# Patient Record
Sex: Female | Born: 1940 | Race: White | Hispanic: No | Marital: Married | Smoking: Former smoker
Health system: Southern US, Community
[De-identification: ages and names within clinical notes are randomized; demographics above are authoritative.]

## PROBLEM LIST (undated history)

## (undated) DIAGNOSIS — F32A Depression, unspecified: Secondary | ICD-10-CM

## (undated) DIAGNOSIS — M419 Scoliosis, unspecified: Secondary | ICD-10-CM

## (undated) DIAGNOSIS — G4730 Sleep apnea, unspecified: Secondary | ICD-10-CM

## (undated) DIAGNOSIS — E785 Hyperlipidemia, unspecified: Secondary | ICD-10-CM

## (undated) DIAGNOSIS — M810 Age-related osteoporosis without current pathological fracture: Secondary | ICD-10-CM

## (undated) DIAGNOSIS — B029 Zoster without complications: Secondary | ICD-10-CM

## (undated) DIAGNOSIS — F329 Major depressive disorder, single episode, unspecified: Secondary | ICD-10-CM

## (undated) HISTORY — DX: Hyperlipidemia, unspecified: E78.5

## (undated) HISTORY — PX: ABDOMINAL HERNIA REPAIR: SHX539

## (undated) HISTORY — DX: Sleep apnea, unspecified: G47.30

## (undated) HISTORY — DX: Depression, unspecified: F32.A

## (undated) HISTORY — PX: BUNIONECTOMY: SHX129

## (undated) HISTORY — DX: Major depressive disorder, single episode, unspecified: F32.9

## (undated) HISTORY — DX: Zoster without complications: B02.9

## (undated) HISTORY — PX: HAMMER TOE SURGERY: SHX385

## (undated) HISTORY — DX: Scoliosis, unspecified: M41.9

## (undated) HISTORY — DX: Age-related osteoporosis without current pathological fracture: M81.0

## (undated) HISTORY — PX: OTHER SURGICAL HISTORY: SHX169

---

## 1998-01-02 ENCOUNTER — Ambulatory Visit: Admission: RE | Admit: 1998-01-02 | Discharge: 1998-01-02 | Payer: Self-pay | Admitting: Internal Medicine

## 1998-05-04 ENCOUNTER — Ambulatory Visit: Admission: RE | Admit: 1998-05-04 | Discharge: 1998-05-04 | Payer: Self-pay | Admitting: Internal Medicine

## 1998-06-21 ENCOUNTER — Emergency Department (HOSPITAL_COMMUNITY): Admission: EM | Admit: 1998-06-21 | Discharge: 1998-06-21 | Payer: Self-pay | Admitting: Emergency Medicine

## 1998-10-03 ENCOUNTER — Emergency Department (HOSPITAL_COMMUNITY): Admission: EM | Admit: 1998-10-03 | Discharge: 1998-10-03 | Payer: Self-pay | Admitting: Emergency Medicine

## 1998-10-03 ENCOUNTER — Encounter: Payer: Self-pay | Admitting: Emergency Medicine

## 1998-11-15 ENCOUNTER — Encounter: Payer: Self-pay | Admitting: Obstetrics and Gynecology

## 1998-11-15 ENCOUNTER — Ambulatory Visit (HOSPITAL_COMMUNITY): Admission: RE | Admit: 1998-11-15 | Discharge: 1998-11-15 | Payer: Self-pay | Admitting: Obstetrics and Gynecology

## 1999-07-09 ENCOUNTER — Encounter: Admission: RE | Admit: 1999-07-09 | Discharge: 1999-07-09 | Payer: Self-pay | Admitting: General Surgery

## 1999-07-09 ENCOUNTER — Encounter: Payer: Self-pay | Admitting: General Surgery

## 1999-07-10 ENCOUNTER — Encounter (INDEPENDENT_AMBULATORY_CARE_PROVIDER_SITE_OTHER): Payer: Self-pay | Admitting: Specialist

## 1999-07-10 ENCOUNTER — Ambulatory Visit (HOSPITAL_BASED_OUTPATIENT_CLINIC_OR_DEPARTMENT_OTHER): Admission: RE | Admit: 1999-07-10 | Discharge: 1999-07-10 | Payer: Self-pay | Admitting: Anesthesiology

## 1999-12-15 ENCOUNTER — Encounter: Payer: Self-pay | Admitting: Internal Medicine

## 1999-12-15 ENCOUNTER — Ambulatory Visit (HOSPITAL_COMMUNITY): Admission: RE | Admit: 1999-12-15 | Discharge: 1999-12-15 | Payer: Self-pay | Admitting: Internal Medicine

## 1999-12-24 ENCOUNTER — Encounter: Admission: RE | Admit: 1999-12-24 | Discharge: 1999-12-24 | Payer: Self-pay | Admitting: Internal Medicine

## 1999-12-24 ENCOUNTER — Encounter: Payer: Self-pay | Admitting: Internal Medicine

## 2000-02-13 ENCOUNTER — Encounter: Payer: Self-pay | Admitting: Obstetrics and Gynecology

## 2000-02-13 ENCOUNTER — Encounter: Admission: RE | Admit: 2000-02-13 | Discharge: 2000-02-13 | Payer: Self-pay | Admitting: Obstetrics and Gynecology

## 2000-07-07 ENCOUNTER — Encounter: Admission: RE | Admit: 2000-07-07 | Discharge: 2000-07-07 | Payer: Self-pay | Admitting: Family Medicine

## 2000-08-06 ENCOUNTER — Encounter: Admission: RE | Admit: 2000-08-06 | Discharge: 2000-08-06 | Payer: Self-pay | Admitting: Sports Medicine

## 2000-08-16 ENCOUNTER — Encounter: Admission: RE | Admit: 2000-08-16 | Discharge: 2000-08-16 | Payer: Self-pay | Admitting: Sports Medicine

## 2000-08-16 ENCOUNTER — Encounter: Payer: Self-pay | Admitting: Sports Medicine

## 2000-09-16 ENCOUNTER — Encounter: Admission: RE | Admit: 2000-09-16 | Discharge: 2000-09-16 | Payer: Self-pay | Admitting: Family Medicine

## 2000-10-27 ENCOUNTER — Encounter: Admission: RE | Admit: 2000-10-27 | Discharge: 2000-10-27 | Payer: Self-pay | Admitting: Family Medicine

## 2000-12-27 ENCOUNTER — Encounter: Admission: RE | Admit: 2000-12-27 | Discharge: 2000-12-27 | Payer: Self-pay | Admitting: Family Medicine

## 2000-12-27 ENCOUNTER — Encounter: Payer: Self-pay | Admitting: Family Medicine

## 2001-01-12 ENCOUNTER — Observation Stay (HOSPITAL_COMMUNITY): Admission: RE | Admit: 2001-01-12 | Discharge: 2001-01-13 | Payer: Self-pay | Admitting: Orthopedic Surgery

## 2001-04-06 ENCOUNTER — Encounter: Admission: RE | Admit: 2001-04-06 | Discharge: 2001-04-06 | Payer: Self-pay | Admitting: Family Medicine

## 2001-04-12 ENCOUNTER — Encounter: Admission: RE | Admit: 2001-04-12 | Discharge: 2001-04-12 | Payer: Self-pay | Admitting: Sports Medicine

## 2001-05-24 ENCOUNTER — Encounter: Admission: RE | Admit: 2001-05-24 | Discharge: 2001-05-24 | Payer: Self-pay | Admitting: Sports Medicine

## 2002-03-15 ENCOUNTER — Encounter: Admission: RE | Admit: 2002-03-15 | Discharge: 2002-03-15 | Payer: Self-pay | Admitting: Family Medicine

## 2002-03-17 ENCOUNTER — Encounter: Admission: RE | Admit: 2002-03-17 | Discharge: 2002-03-17 | Payer: Self-pay | Admitting: Family Medicine

## 2002-03-28 ENCOUNTER — Encounter: Admission: RE | Admit: 2002-03-28 | Discharge: 2002-03-28 | Payer: Self-pay | Admitting: Family Medicine

## 2002-03-28 ENCOUNTER — Encounter: Payer: Self-pay | Admitting: Family Medicine

## 2002-04-04 ENCOUNTER — Encounter: Admission: RE | Admit: 2002-04-04 | Discharge: 2002-04-04 | Payer: Self-pay | Admitting: Family Medicine

## 2002-04-12 ENCOUNTER — Encounter: Admission: RE | Admit: 2002-04-12 | Discharge: 2002-04-12 | Payer: Self-pay | Admitting: Family Medicine

## 2003-03-08 ENCOUNTER — Encounter: Admission: RE | Admit: 2003-03-08 | Discharge: 2003-03-08 | Payer: Self-pay | Admitting: Family Medicine

## 2003-04-03 ENCOUNTER — Encounter: Admission: RE | Admit: 2003-04-03 | Discharge: 2003-04-03 | Payer: Self-pay | Admitting: Family Medicine

## 2003-04-03 ENCOUNTER — Encounter: Payer: Self-pay | Admitting: Family Medicine

## 2003-04-11 ENCOUNTER — Encounter: Admission: RE | Admit: 2003-04-11 | Discharge: 2003-04-11 | Payer: Self-pay | Admitting: Family Medicine

## 2003-10-30 ENCOUNTER — Encounter: Admission: RE | Admit: 2003-10-30 | Discharge: 2003-10-30 | Payer: Self-pay | Admitting: Sports Medicine

## 2004-07-03 ENCOUNTER — Ambulatory Visit: Payer: Self-pay | Admitting: Family Medicine

## 2004-07-04 ENCOUNTER — Encounter: Admission: RE | Admit: 2004-07-04 | Discharge: 2004-07-04 | Payer: Self-pay | Admitting: Sports Medicine

## 2004-07-16 ENCOUNTER — Ambulatory Visit: Payer: Self-pay | Admitting: Family Medicine

## 2004-08-13 ENCOUNTER — Ambulatory Visit: Payer: Self-pay | Admitting: Family Medicine

## 2004-08-13 ENCOUNTER — Encounter (INDEPENDENT_AMBULATORY_CARE_PROVIDER_SITE_OTHER): Payer: Self-pay | Admitting: *Deleted

## 2004-08-13 ENCOUNTER — Encounter (INDEPENDENT_AMBULATORY_CARE_PROVIDER_SITE_OTHER): Payer: Self-pay | Admitting: Family Medicine

## 2004-08-13 LAB — CONVERTED CEMR LAB

## 2005-10-15 ENCOUNTER — Ambulatory Visit (HOSPITAL_BASED_OUTPATIENT_CLINIC_OR_DEPARTMENT_OTHER): Admission: RE | Admit: 2005-10-15 | Discharge: 2005-10-15 | Payer: Self-pay | Admitting: Orthopedic Surgery

## 2006-08-13 ENCOUNTER — Encounter (INDEPENDENT_AMBULATORY_CARE_PROVIDER_SITE_OTHER): Payer: Self-pay | Admitting: *Deleted

## 2006-11-03 ENCOUNTER — Ambulatory Visit (HOSPITAL_COMMUNITY): Admission: RE | Admit: 2006-11-03 | Discharge: 2006-11-03 | Payer: Self-pay | Admitting: Orthopedic Surgery

## 2007-02-08 ENCOUNTER — Encounter: Admission: RE | Admit: 2007-02-08 | Discharge: 2007-02-08 | Payer: Self-pay | Admitting: Orthopedic Surgery

## 2008-02-02 ENCOUNTER — Encounter: Admission: RE | Admit: 2008-02-02 | Discharge: 2008-02-02 | Payer: Self-pay | Admitting: Family Medicine

## 2009-07-23 ENCOUNTER — Ambulatory Visit (HOSPITAL_COMMUNITY): Admission: RE | Admit: 2009-07-23 | Discharge: 2009-07-24 | Payer: Self-pay | Admitting: Surgery

## 2009-07-23 ENCOUNTER — Encounter (INDEPENDENT_AMBULATORY_CARE_PROVIDER_SITE_OTHER): Payer: Self-pay | Admitting: Surgery

## 2010-07-05 ENCOUNTER — Encounter: Payer: Self-pay | Admitting: Family Medicine

## 2010-07-14 ENCOUNTER — Inpatient Hospital Stay (HOSPITAL_COMMUNITY)
Admission: RE | Admit: 2010-07-14 | Discharge: 2010-07-15 | Payer: Self-pay | Source: Home / Self Care | Attending: Surgery | Admitting: Surgery

## 2010-07-15 NOTE — Op Note (Signed)
NAMEEMELLY, WURTZ                ACCOUNT NO.:  [id number]  MEDICAL RECORD NO.:  [id number]          PATIENT TYPE:  INP  LOCATION:  0006                         FACILITY:  [facility]  PHYSICIAN:  Wilmon Arms. Corliss Skains, M.D. DATE OF BIRTH:  [birth year]  DATE OF PROCEDURE:  07/14/2010 DATE OF DISCHARGE:                              OPERATIVE REPORT   PREOPERATIVE DIAGNOSIS:  Recurrent ventral hernia.  POSTOPERATIVE DIAGNOSIS:  Recurrent ventral hernia.  PROCEDURE PERFORMED:  Laparoscopic ventral hernia repair with mesh.  SURGEON:  Wilmon Arms. Marjoria Mancillas, M.D.  ANESTHESIA:  General  INDICATIONS:  This is a 70 year old female who is obese who had a small umbilical hernia repaired with mesh approximately 1 year ago.  Over the last few months, she has developed some swelling in the midline above her previous surgical site.  We felt that she had a ventral hernia.  She presents now for repair.  DESCRIPTION OF PROCEDURE:  The patient was brought to the operating room, placed supine position on the operating table.  After an adequate level of general anesthesia was obtained, a Foley catheter was placed in a sterile technique.  The patient's abdomen was prepped with Chloraprep and draped in a sterile fashion.  Time-out was taken to assure proper patient, proper procedure.  We used a 5-mm Optiview trocar in the left subcostal region to cannulate the peritoneal cavity.  We insufflated CO2 maintaining maximal pressure of 50 mmHg.  The laparoscope was inserted and we could visualize a lot of omentum entering hernia defect above the umbilicus.  We placed 11-mm port as well as another 5-mm port along the left anterior axillary line.  We were able to reduce the omentum out of the hernia defect using traction and the Harmonic scalpel.  No bowel was noted within the hernia.  We could visualize the mesh at the lower end of our hernia defect.  It seemed that she had developed hernia just above her previous  surgical site.  We measured the hernia defect and it seem to be about 4 cm across.  There was another tiny defect just inferior to this.  The entire area was about 7 cm.  We took a 15 x 20 cm sheet of mesh and placed 6 stay sutures of 0 Novafil, we rolled it up and inserted.  We used the Endoclose device to pull up stay sutures through small stab incisions.  The stay sutures were tied down which deployed the mesh to cover the hernia defects.  The secure strap device was then used to place tacks at 1 cm intervals all the way around the edge of the mesh.  Hemostasis was good.  We did have to take down a part of the falciform ligament with Harmonic scalpel to allow adequate placement of the mesh.  We once again checked our mesh which seem to be providing good coverage of the hernia.  We removed the 11-mm trocar and closed the fascia with the 0 Vicryl Endoclose device.  The 5 mm was removed.  The 4-0 Monocryl was used to close skin incisions.  Dermabond was used to seal all the skin incisions.  The patient was then extubated, brought to recovery in stable condition.  All sponge, instrument, needle counts were correct.     Wilmon Arms. Corliss Skains, M.D.     MKT/MEDQ  D:  07/14/2010  T:  07/14/2010  Job:  [id number]  Electronically Signed by Manus Rudd M.D. on 07/15/2010 03:51:55 PM

## 2010-07-28 NOTE — Discharge Summary (Signed)
  Kara Bautista, Kara Bautista                ACCOUNT NO.:  [id number]  MEDICAL RECORD NO.:  [id number]          PATIENT TYPE:  INP  LOCATION:  1531                         FACILITY:  [facility]  PHYSICIAN:  Wilmon Arms. Corliss Skains, M.D. DATE OF BIRTH:  [birth year]  DATE OF ADMISSION:  07/14/2010 DATE OF DISCHARGE:  07/15/2010                              DISCHARGE SUMMARY   ADMISSION DIAGNOSIS:  Recurrent ventral hernia.  DISCHARGE DIAGNOSIS:  Recurrent ventral hernia.  OTHER DIAGNOSES:  Include sleep apnea and hypercholesterolemia.  PROCEDURE:  Laparoscopic ventral hernia repair with mesh.  BRIEF HISTORY:  This is a 70 year old female who is morbidly obese, who had a small umbilical hernia repair with mesh.  She has developed some swelling in her midline above her previous surgical site.  She was brought to the Operating Room for repair.  HOSPITAL COURSE:  The patient underwent laparoscopic ventral hernia repair with mesh on July 14, 2010.  She developed a hernia above her previous mesh.  This was repaired with a large piece of mesh laparoscopically.  The patient did quite well and on postop day #1 was ready for discharge.  Her pain was controlled with p.o. pain medication. She has been tolerating p.o.'s and has been voiding without any difficulty.  She will be discharged home.  DISCHARGE INSTRUCTIONS:  The patient is given Percocet p.r.n. for pain. She should wear abdominal binder.  Followup in 2-3 weeks with Dr. Ria Bush.  Call [phone] for any problems.     Wilmon Arms. Corliss Skains, M.D.     MKT/MEDQ  D:  07/25/2010  T:  07/25/2010  Job:  [id number]  Electronically Signed by Manus Rudd M.D. on 07/28/2010 04:53:09 PM

## 2010-08-19 ENCOUNTER — Other Ambulatory Visit: Payer: Self-pay | Admitting: Surgery

## 2010-08-19 DIAGNOSIS — R609 Edema, unspecified: Secondary | ICD-10-CM

## 2010-08-22 ENCOUNTER — Ambulatory Visit
Admission: RE | Admit: 2010-08-22 | Discharge: 2010-08-22 | Disposition: A | Discharge status: Home / Self Care | Payer: Medicare Other | Source: Ambulatory Visit | Attending: Surgery | Admitting: Surgery

## 2010-08-22 DIAGNOSIS — R609 Edema, unspecified: Secondary | ICD-10-CM

## 2010-08-22 MED ORDER — IOHEXOL 300 MG/ML  SOLN
125.0000 mL | Freq: Once | INTRAMUSCULAR | Status: AC | PRN
  Administered 2010-08-22: 125 mL via INTRAVENOUS

## 2010-08-26 ENCOUNTER — Other Ambulatory Visit: Payer: Self-pay | Admitting: Family Medicine

## 2010-08-27 ENCOUNTER — Other Ambulatory Visit (HOSPITAL_COMMUNITY): Payer: Self-pay | Admitting: Surgery

## 2010-08-27 DIAGNOSIS — IMO0002 Reserved for concepts with insufficient information to code with codable children: Secondary | ICD-10-CM

## 2010-08-28 ENCOUNTER — Ambulatory Visit (HOSPITAL_COMMUNITY): Admission: RE | Admit: 2010-08-28 | Payer: Medicare Other | Source: Ambulatory Visit | Admitting: Surgery

## 2010-08-28 ENCOUNTER — Ambulatory Visit (HOSPITAL_COMMUNITY)
Admission: RE | Admit: 2010-08-28 | Discharge: 2010-08-28 | Disposition: A | Discharge status: Home / Self Care | Payer: Medicare Other | Source: Ambulatory Visit | Attending: Surgery | Admitting: Surgery

## 2010-08-28 DIAGNOSIS — Y838 Other surgical procedures as the cause of abnormal reaction of the patient, or of later complication, without mention of misadventure at the time of the procedure: Secondary | ICD-10-CM | POA: Insufficient documentation

## 2010-08-28 DIAGNOSIS — Z79899 Other long term (current) drug therapy: Secondary | ICD-10-CM | POA: Insufficient documentation

## 2010-08-28 DIAGNOSIS — E7800 Pure hypercholesterolemia, unspecified: Secondary | ICD-10-CM | POA: Insufficient documentation

## 2010-08-28 DIAGNOSIS — G4730 Sleep apnea, unspecified: Secondary | ICD-10-CM | POA: Insufficient documentation

## 2010-08-28 DIAGNOSIS — IMO0002 Reserved for concepts with insufficient information to code with codable children: Secondary | ICD-10-CM

## 2010-08-28 LAB — CBC
HCT: 40.1 % (ref 36.0–46.0)
Hemoglobin: 12.7 g/dL (ref 12.0–15.0)
RBC: 4.1 MIL/uL (ref 3.87–5.11)
RDW: 12.4 % (ref 11.5–15.5)
WBC: 9.1 10*3/uL (ref 4.0–10.5)

## 2010-08-28 LAB — BODY FLUID CELL COUNT WITH DIFFERENTIAL
Eos, Fluid: 57 %
Monocyte-Macrophage-Serous Fluid: 6 % — ABNORMAL LOW (ref 50–90)

## 2010-08-28 LAB — PROTIME-INR: INR: 1.04 (ref 0.00–1.49)

## 2010-08-28 LAB — APTT: aPTT: 29 seconds (ref 24–37)

## 2010-09-01 LAB — BODY FLUID CULTURE

## 2010-09-03 LAB — BASIC METABOLIC PANEL
BUN: 21 mg/dL (ref 6–23)
Chloride: 103 mEq/L (ref 96–112)
Glucose, Bld: 96 mg/dL (ref 70–99)
Potassium: 4.7 mEq/L (ref 3.5–5.1)

## 2010-09-03 LAB — CBC
HCT: 40.7 % (ref 36.0–46.0)
MCV: 94.8 fL (ref 78.0–100.0)
Platelets: 324 10*3/uL (ref 150–400)
RDW: 12.7 % (ref 11.5–15.5)

## 2010-09-03 LAB — DIFFERENTIAL
Basophils Absolute: 0 10*3/uL (ref 0.0–0.1)
Eosinophils Absolute: 0.2 10*3/uL (ref 0.0–0.7)
Eosinophils Relative: 3 % (ref 0–5)

## 2010-10-28 NOTE — Op Note (Signed)
NAMEBRENDY, Kara Bautista                ACCOUNT NO.:  [id number]   MEDICAL RECORD NO.:  [id number]          PATIENT TYPE:  AMB   LOCATION:  DAY                          FACILITY:  [facility]   PHYSICIAN:  Marlowe Kays, M.D.  DATE OF BIRTH:  05-24-1941   DATE OF PROCEDURE:  11/03/2006  DATE OF DISCHARGE:                               OPERATIVE REPORT   PREOPERATIVE DIAGNOSES:  1. Left second claw toe.  2. Subluxation of proximal interphalangeal joint of left little toe.   POSTOPERATIVE DIAGNOSES:  1. Left second claw toe.  2. Subluxation of proximal interphalangeal joint of left little toe.   OPERATION:  1. Resection of base of proximal phalanx with pin fixation, second      toe.  2. Fusion of PIP joint, fifth toe left foot.   SURGEON:  Marlowe Kays, M.D.   ASSISTANT:  Mr. Adrian Blackwater.   ANESTHESIA:  General.   PATHOLOGY AND JUSTIFICATION FOR PROCEDURE:  She has had a previous  fusion of the PIP joint of her left second toe.  Her great toe is  tending to go into valgus and elevating up the second toe and she has  subluxation of the MP joint of the second toe with clawing.  She had  hoped to avoid any additional surgery on the great toe, and it was felt  that correction of the hyperextension deformity of the second toe would  be all that was needed.  She also has a painful callosity on the inner  aspect of the little toe left foot.  On x-ray there is subluxation of  the distal phalanx to the outer side of the proximal phalanx lying on  its outer condyle and the proximal phalanx is jutting medially, and this  is the source of the painful corn.  I felt that because of the  disruption of the joint, a fusion of this toe with straightening out  would be the treatment of choice.   PROCEDURE:  Under satisfactory general anesthesia, time-out was  performed.  Pneumatic tourniquet.  Esmarch on the left leg nonsterilely  and prepped the leg from mid calf to toes with DuraPrep, draped  into a  sterile field.  I went through the old dorsal incision over the proximal  phalanx and MP joint of the left second toe.  The joint was identified  and the proximal portion of the proximal phalanx also.  Protecting the  underneath tendons with baby Hohmann's, I used a micro saw to amputate  the proximal phalanx at the metaphyseal flare and then because of the  soft bone had to remove the resected base piecemeal with small rongeur.  I then used a 0.045 smooth K-wire, placing it from proximal to distal  first and then distal to proximal, holding the second toe in corrected  position.  I next made a dorsal extensor-splitting incision over the  little toe, exposing the two joint surfaces of the PIP joint and cutting  off the articular cartilage in squared off fashion with bone cutter.  I  then also removed a small amount of the medial portion  of the distal  condyle of the proximal phalanx.  I then placed a smooth 0.045 K-wire  from proximal to distal first and then distal to proximal with good  apposition of the raw bone.  I then released the tourniquet.  Both toes  pinked up nicely.  Pins were bent, cut and capped, and both wounds  closed in identical fashion with running 4-0  Vicryl in the tendon and interrupted 4-0 nylon mattress sutures in the  skin.  Betadine and Adaptic dry sterile dressing were applied.  She  tolerated the procedure well and was taken to recovery in satisfactory  condition with no known complications.           ______________________________  Marlowe Kays, M.D.     JA/MEDQ  D:  11/03/2006  T:  11/03/2006  Job:  [id number]

## 2010-10-31 NOTE — Op Note (Signed)
[place]  Patient:    Kara Bautista                        MRN: [id number] Proc. Date: 07/10/99 Adm. Date:  [id number] Attending:  Arlis Porta                           Operative Report  PREOPERATIVE DIAGNOSIS:  Bilateral breast masses (on the right side at the 11 oclock position, on the left side at the 8 oclock position).  POSTOPERATIVE DIAGNOSIS:  Bilateral breast masses (on the right side at the 11 oclock position, on the left side at the 8 oclock position).  OPERATION PERFORMED:  Bilateral breast excisional biopsies.  SURGEON: Adolph Pollack, M.D.  ANESTHESIA:  Local (1% lidocaine with epinephrine plus 0.5% plain Marcaine plus  sodium bicarbonate) with MAC.  INDICATIONS FOR PROCEDURE:  The patient is a 70 year old female who has been noted to have a slightly enlarging right breast mass at the 11 oclock position.  When I examined her, I also found a dominant mass in the left breast.  She now presents for biopsies of both masses.  DESCRIPTION OF PROCEDURE:  She was placed supine on the table and the masses were marked.  She was then given intravenous sedation and both breasts were sterilely prepped and draped.  I first approached the left breast and infiltrated local anesthetic in a curvilinear fashion directly over the mass.  An incision was made through the skin and dermis directly over the mass.  Small flaps were raised and the mass was excised sharply and sent to pathology for evaluation.  Bleeding was controlled with cautery.  The wound was closed in two layers.  The subcutaneous fat was loosely approximated with a 3-0 Vicryl suture and the skin closed with 4-0 Monocryl subcuticular stitch.  Steri-Strips were applied.  Next, gloves, Bovie tip and instruments were changed as well as the injection needle.  The right side was approached.  A curviliner wheal of local anesthesia was infiltrated directly over the  mass both superficially and deep.  A curvilinear incision was then made through the skin and dermis directly over the mass and its flaps were raised.  Allis forcep was used to grasp the mass.  Then the mass was  excised sharply with some surrounding normal-appearing breast tissue.  This was  sent to pathology marked right mass.  Bleeding in the wound was controlled with electrocautery.  Once hemostasis was adequate, the subcutaneous tissue was loosely closed with interrupted 3-0 Vicryl sutures.  The skin was closed with 4-0 Monocryl subcuticular stitch. Steri-Strips were then placed.  Sterile dressings were placed across both wounds and an Ace rap applied.  The patient tolerated the procedure well without any apparent complications and was taken to the recovery room in satisfactory condition. DD:  07/10/99 TD:  07/11/99 Job: [id number]

## 2010-10-31 NOTE — Op Note (Signed)
Kara Bautista, Kara Bautista                ACCOUNT NO.:  [id number]   MEDICAL RECORD NO.:  [id number]          PATIENT TYPE:  AMB   LOCATION:  NESC                         FACILITY:  [facility]   PHYSICIAN:  Marlowe Kays, M.D.  DATE OF BIRTH:  November 23, 1940   DATE OF PROCEDURE:  10/15/2005  DATE OF DISCHARGE:                                 OPERATIVE REPORT   PREOPERATIVE DIAGNOSIS:  Symptomatic left second claw toe deformity.   POSTOPERATIVE DIAGNOSIS:  Symptomatic left second claw toe deformity.   OPERATIONS:  Fusion of PIP joint left second toe.   SURGEON:  Marlowe Kays, M.D.   ASSISTANT:  Nurse.   ANESTHESIA:  General anesthesia.   DESCRIPTION OF PROCEDURE:  She had previously had a bunionectomy and  correction of a slight claw toe deformity with Z lengthening of extensor  tendon and dorsal capsulotomy of the MP joint.  This has done well until  recently when the claw toe deformity has returned with a fixed contracture  at the PIP joint.  Consequently, she is here for the above-mentioned  surgery.   PROCEDURE:  Satisfied general anesthesia, left leg was Esmarched out  nonsterilely and prepped from mid calf to toes with DuraPrep and draped in a  sterile field.  I made a dorsal extensor splitting incision exposing the  contracted PIP joint with sharp dissection.  The distal portion of the  proximal phalanx, the proximal portion of the middle phalanx were then  resected with a combination of bone cutter, rongeur and small osteotome  until we had adequate bone resection to correct the deformity and good  squared off raw bone. I then irrigated the wound well with sterile saline  and placed a 0.045 smooth K-wire first from proximal to distal and then from  distal to proximal into the second metatarsal head correcting the deformity  with the second toe in a neutral position.  I had small amounts of cancellus  bone taken from the fusion preparation to the dorsum of the fused surface.  The  wound was then closed with running 4-0 Vicryl in the extensor tendon  mechanism and interrupted 4-0 nylon mattress sutures in the skin.  Toe was  blocked with 0.5% plain Marcaine.  Betadine, Adaptic and dry sterile  dressing were applied.  First, the tourniquet was released and her toe  pinked up nicely.  She was on her way to the recovery room in satisfactory  condition at the time of this dictation with no known complications.           ______________________________  Marlowe Kays, M.D.     JA/MEDQ  D:  10/15/2005  T:  10/16/2005  Job:  [id number]

## 2010-10-31 NOTE — Op Note (Signed)
[facility]  Patient:    Kara Bautista, Kara Bautista                       MRN: [id number] Proc. Date: 01/12/01 Adm. Date:  [id number] Attending:  Marlowe Kays Page                           Operative Report  PREOPERATIVE DIAGNOSES: 1. Painful bunion with hallux valgus deformity, right foot. 2. Painful bunion with hallux valgus deformity and flexible clawtoe deformity    second toe of left foot.  OPERATIONS: 1. Simple bunionectomy of right foot. 2. Alfred Levins bunionectomy of left foot. 3. Correction of clawtoe deformity of left second toe with C lengthening of    extensor tendon and dorsal capsulotomy in P joint, great toe.  SURGEON:  Illene Labrador. [place], M.D.  ASSISTANT:  Nurse.  ANESTHESIA:  General.  PATHOLOGY AND JUSTIFICATION FOR PROCEDURE:  She has prominent tender bunions which were becoming a real functional problem because of inability to wear shoes.  Her right foot had a 10 degree first and second metatarsal angle on standing film, and the valgus of the great toe was passively correctable, and I had hoped that a simple bunionectomy would correct the deformity.  In the left foot, she had a 12 degree first and second metatarsal angle with valgus deformity that was not completely correctable passively, and flexible mild clawing of the second toe which I had hoped to be treated without fusing the PIP joint.  DESCRIPTION OF PROCEDURE:  Satisfactory general anesthesia, bilateral pneumatic tourniquets, DuraPrep to foot and ankles, and both feet and ankles are draped in a sterile field.  Working first on the right foot, I made dorsomedial incision down to the capsule of the MP joint.  The dorsal sensory nerve was identified and protected.  The capsule was opened with the flap placed distally.  The joint was inspected, and a few adhesions were resected. The large bunion was excised, working first with an osteotome, making a cut proximally and then working  retrograde from distal to proximal removing of the bunion with the osteotome and then trimming up the remainder of the bunion deformity with small rongeur.  With proximal tension on the capsule of the MP joint, I was able to correct the valgus deformity of the great toe without difficulty.  Accordingly, the wound was irrigated with sterile saline and with the great toe held in the correct position, the flap was reattached proximally first, and then along the perimeter with multiple interrupted 0 Vicryl, correcting the valgus deformity.  The skin and subcutaneous tissue were then closed as a unit with interrupted 4-0 nylon mattress sutures.  The incision and great toe were blocked with 0.5% plain Marcaine.  Betadine, Adaptic, and dry sterile dressings were applied, and the tourniquet was then released. Then went on to the left foot where a similar incision was made for the bunion as well as similar exposure.  Once I had removed the large bunion deformity as I had on the right foot, including a small dorsal bunion deformity, I then measured along the cut portion of the bunion 1 cm from the articular surface and then 6 mm proximal to it.  Then with a microsaw, I made a transverse cut at the distal marker and then an oblique cut, leaving the lateral cortex intact from the proximal mark.  I then perforated the lateral cortex with very tiny  osteotome, was able to correct the osteotomy closed with an excellent position medially and appeared to be nice and stable, and this also corrected the significant valgus deformity of the great toe.  The wound was then irrigated with sterile saline, and the capsule and skin and subcutaneous tissue was closed as on the right foot.  I then made a dorsal incision, extending from just past the PIP joint to proximal to the MP joint of the second toe.  The extensor tendon was identified and split longitudinally and then detached distally on the lateral side and  proximally on the medial side which significantly improved the clawing deformity but did not completely correct it.  Accordingly, I then located the MP joint of the great toe and made a dorsal capsulotomy with the 15 knife blade supplemented with some small scissors, and this allowed me to passively correct the deformity of the second toe.  After irrigating the wound, I reapproximated the extensor tendon in a position of correction which gave me about 8 mm of lengthening with interrupted 4-0 Vicryl.  The subcutaneous tissue was closed with the same. Skin with interrupted 4-0 nylon mattress sutures.  The great and second toes were also blocked with 0.5% plain Marcaine, followed by Betadine, Adaptic, and dry dressing.  I then splinted the great toe adjacent to the foot with a tongue blade which was well padded with sterile Webril and incorporated this in additional dressing, stabilizing the great toe and also keeping the second toe in a neutral position.  The left tourniquet was then released.  She tolerated the procedure well and was taken to the recovery room in satisfactory condition with no known complications. DD:  01/12/01 TD:  01/12/01 Job: [id number]

## 2012-04-19 ENCOUNTER — Other Ambulatory Visit: Payer: Self-pay | Admitting: Orthopedic Surgery

## 2012-04-19 DIAGNOSIS — M419 Scoliosis, unspecified: Secondary | ICD-10-CM

## 2012-04-27 ENCOUNTER — Ambulatory Visit
Admission: RE | Admit: 2012-04-27 | Discharge: 2012-04-27 | Disposition: A | Discharge status: Home / Self Care | Payer: Medicare Other | Source: Ambulatory Visit | Attending: Orthopedic Surgery | Admitting: Orthopedic Surgery

## 2012-04-27 DIAGNOSIS — M419 Scoliosis, unspecified: Secondary | ICD-10-CM

## 2012-05-02 ENCOUNTER — Inpatient Hospital Stay
Admission: RE | Admit: 2012-05-02 | Discharge: 2012-05-02 | Disposition: A | Discharge status: Home / Self Care | Payer: Self-pay | Source: Ambulatory Visit | Attending: *Deleted | Admitting: *Deleted

## 2012-05-02 ENCOUNTER — Ambulatory Visit
Admission: RE | Admit: 2012-05-02 | Discharge: 2012-05-02 | Disposition: A | Discharge status: Home / Self Care | Payer: Medicare Other | Source: Ambulatory Visit | Attending: Orthopedic Surgery | Admitting: Orthopedic Surgery

## 2012-05-02 ENCOUNTER — Other Ambulatory Visit: Payer: Self-pay | Admitting: *Deleted

## 2012-05-02 VITALS — BP 123/63 | HR 63 | Ht 65.5 in | Wt 240.0 lb

## 2012-05-02 DIAGNOSIS — M419 Scoliosis, unspecified: Secondary | ICD-10-CM

## 2012-05-02 DIAGNOSIS — M4120 Other idiopathic scoliosis, site unspecified: Secondary | ICD-10-CM

## 2012-05-02 MED ORDER — IOHEXOL 180 MG/ML  SOLN
17.0000 mL | Freq: Once | INTRAMUSCULAR | Status: AC | PRN
  Administered 2012-05-02: 17 mL via INTRATHECAL

## 2012-05-02 MED ORDER — DIAZEPAM 5 MG PO TABS
5.0000 mg | ORAL_TABLET | Freq: Once | ORAL | Status: AC
  Administered 2012-05-02: 5 mg via ORAL

## 2013-06-01 ENCOUNTER — Ambulatory Visit (INDEPENDENT_AMBULATORY_CARE_PROVIDER_SITE_OTHER): Payer: Medicare Other | Admitting: Cardiology

## 2013-06-01 ENCOUNTER — Encounter: Payer: Self-pay | Admitting: Cardiology

## 2013-06-01 VITALS — BP 120/76 | HR 64 | Ht 65.0 in | Wt 228.8 lb

## 2013-06-01 DIAGNOSIS — I351 Nonrheumatic aortic (valve) insufficiency: Secondary | ICD-10-CM

## 2013-06-01 DIAGNOSIS — R0602 Shortness of breath: Secondary | ICD-10-CM

## 2013-06-01 DIAGNOSIS — I359 Nonrheumatic aortic valve disorder, unspecified: Secondary | ICD-10-CM

## 2013-06-01 NOTE — Progress Notes (Signed)
HPI The patient presents for evaluation of an abnormal echocardiogram.  The patient has been seen in the past and for evaluation of some dyspnea. In 2011 she had stress perfusion study which was reported to be normal. She also had an echocardiogram which demonstrated moderate aortic insufficiency and mild to moderate mitral regurgitation. However, she doesn't recall these results and was never called by the cardiologist she saw at that time. She never had followup.  Recently Dr. Ricki Miller obtained these results and now sends her for follow up.  She has not had any other cardiovascular complaints. She does not exercise routinely though she wants to get back into this. She was doing some water aerobics several months ago.  She denies any chest pressure, neck or arm discomfort. She doesn't notice any palpitations, presyncope or syncope. She will get short of breathclimbing up a flight of stairs. However, this is chronic. She does not describe PND or orthopnea.  No Known Allergies  Current Outpatient Prescriptions  Medication Sig Dispense Refill  . aspirin 81 MG tablet Take 81 mg by mouth daily.      . Biotin 5000 MCG TABS Take 1 tablet by mouth daily.      . CELEBREX 200 MG capsule Take 1 capsule by mouth daily.      . Cholecalciferol (VITAMIN D-3) 5000 UNITS TABS Take 1 tablet by mouth daily.      . Coenzyme Q10 (CO Q-10) 300 MG CAPS Take 1 capsule by mouth daily.      Marland Kitchen escitalopram (LEXAPRO) 10 MG tablet Take 1 tablet by mouth daily.      . pravastatin (PRAVACHOL) 80 MG tablet Take 1 tablet by mouth daily.       No current facility-administered medications for this visit.    Past Medical History  Diagnosis Date  . Depression   . Sleep apnea   . Hyperlipidemia   . Scoliosis   . Osteoporosis   . Shingles     Past Surgical History  Procedure Laterality Date  . Breast cysts resected    . Left foot surgery    . Bunionectomy    . Hammer toe surgery    . Abdominal hernia repair       Family History  Problem Relation Age of Onset  . Cancer - Lung Father   . Hypertension Father   . Dementia Mother     History   Social History  . Marital Status: Married    Spouse Name: N/A    Number of Children: 2  . Years of Education: N/A   Occupational History  .     Social History Main Topics  . Smoking status: Former Games developer  . Smokeless tobacco: Not on file     Comment: Mild smoker.  quit 45 years ago.    . Alcohol Use: Not on file  . Drug Use: Not on file  . Sexual Activity: Not on file   Other Topics Concern  . Not on file   Social History Narrative  . No narrative on file    ROS:  As stated in the HPI and negative for all other systems.   PHYSICAL EXAM BP 120/76  Pulse 64  Ht 5\' 5"  (1.651 m)  Wt 228 lb 12.8 oz (103.783 kg)  BMI 38.07 kg/m2 GENERAL:  Well appearing HEENT:  Pupils equal round and reactive, fundi not visualized, oral mucosa unremarkable NECK:  No jugular venous distention, waveform within normal limits, carotid upstroke brisk and symmetric, no  bruits, no thyromegaly LYMPHATICS:  No cervical, inguinal adenopathy LUNGS:  Clear to auscultation bilaterally BACK:  No CVA tenderness CHEST:  Unremarkable HEART:  PMI not displaced or sustained,S1 and S2 within normal limits, no S3, no S4, no clicks, no rubs, no murmurs ABD:  Flat, positive bowel sounds normal in frequency in pitch, no bruits, no rebound, no guarding, no midline pulsatile mass, no hepatomegaly, no splenomegaly EXT:  2 plus pulses throughout, no edema, no cyanosis no clubbing SKIN:  No rashes no nodules NEURO:  Cranial nerves II through XII grossly intact, motor grossly intact throughout PSYCH:  Cognitively intact, oriented to person place and time  EKG:   Sinus rhythm, rate 56, axis within normal limits, intervals within normal limits, no acute ST-T wave changes.  06/01/2013   ASSESSMENT AND PLAN  AI/MR:  I reviewed in detail the physiology. The MRI the most recent echo  was mild. I don't have the images. The AI was said to be more moderate. I don't hear either of these valve regurgitations on exam. However, I doubt that these are clinically relevant. I will be checking a BNP level. I think that her dyspnea is most likely related to weight and deconditioning however. I have suggested that when they moved to [place] they reestablish to get another echocardiogram in one year.  OBESITY:  The patient understands the need to lose weight with diet and exercise. We have discussed specific strategies for this.

## 2013-06-01 NOTE — Patient Instructions (Addendum)
The current medical regimen is effective;  continue present plan and medications.  Please have bloodwork today (BNP)  Follow up as needed. 

## 2013-06-03 DIAGNOSIS — I351 Nonrheumatic aortic (valve) insufficiency: Secondary | ICD-10-CM | POA: Insufficient documentation

## 2013-09-23 ENCOUNTER — Encounter (HOSPITAL_COMMUNITY): Payer: Self-pay | Admitting: Emergency Medicine

## 2013-09-23 ENCOUNTER — Emergency Department (HOSPITAL_COMMUNITY)
Admission: EM | Admit: 2013-09-23 | Discharge: 2013-09-23 | Disposition: A | Discharge status: Home / Self Care | Payer: Medicare Other | Attending: Emergency Medicine | Admitting: Emergency Medicine

## 2013-09-23 ENCOUNTER — Emergency Department (HOSPITAL_COMMUNITY): Payer: Medicare Other

## 2013-09-23 DIAGNOSIS — Y929 Unspecified place or not applicable: Secondary | ICD-10-CM | POA: Insufficient documentation

## 2013-09-23 DIAGNOSIS — Z8619 Personal history of other infectious and parasitic diseases: Secondary | ICD-10-CM | POA: Insufficient documentation

## 2013-09-23 DIAGNOSIS — S82209A Unspecified fracture of shaft of unspecified tibia, initial encounter for closed fracture: Secondary | ICD-10-CM | POA: Insufficient documentation

## 2013-09-23 DIAGNOSIS — Y9301 Activity, walking, marching and hiking: Secondary | ICD-10-CM | POA: Insufficient documentation

## 2013-09-23 DIAGNOSIS — W010XXA Fall on same level from slipping, tripping and stumbling without subsequent striking against object, initial encounter: Secondary | ICD-10-CM | POA: Insufficient documentation

## 2013-09-23 DIAGNOSIS — E785 Hyperlipidemia, unspecified: Secondary | ICD-10-CM | POA: Insufficient documentation

## 2013-09-23 DIAGNOSIS — Z7982 Long term (current) use of aspirin: Secondary | ICD-10-CM | POA: Insufficient documentation

## 2013-09-23 DIAGNOSIS — W1809XA Striking against other object with subsequent fall, initial encounter: Secondary | ICD-10-CM | POA: Insufficient documentation

## 2013-09-23 DIAGNOSIS — S82409A Unspecified fracture of shaft of unspecified fibula, initial encounter for closed fracture: Principal | ICD-10-CM

## 2013-09-23 DIAGNOSIS — Z8739 Personal history of other diseases of the musculoskeletal system and connective tissue: Secondary | ICD-10-CM | POA: Insufficient documentation

## 2013-09-23 DIAGNOSIS — Z79899 Other long term (current) drug therapy: Secondary | ICD-10-CM | POA: Insufficient documentation

## 2013-09-23 DIAGNOSIS — Z87891 Personal history of nicotine dependence: Secondary | ICD-10-CM | POA: Insufficient documentation

## 2013-09-23 DIAGNOSIS — F3289 Other specified depressive episodes: Secondary | ICD-10-CM | POA: Insufficient documentation

## 2013-09-23 DIAGNOSIS — F329 Major depressive disorder, single episode, unspecified: Secondary | ICD-10-CM | POA: Insufficient documentation

## 2013-09-23 MED ORDER — DOCUSATE SODIUM 100 MG PO CAPS: 100.0000 mg | ORAL_CAPSULE | Freq: Two times a day (BID) | ORAL | Status: AC

## 2013-09-23 MED ORDER — HYDROCODONE-ACETAMINOPHEN 5-325 MG PO TABS
1.0000 | ORAL_TABLET | Freq: Once | ORAL | Status: AC
  Administered 2013-09-23: 1 via ORAL
  Filled 2013-09-23: qty 1

## 2013-09-23 MED ORDER — HYDROCODONE-ACETAMINOPHEN 5-325 MG PO TABS: 1.0000 | ORAL_TABLET | ORAL | Status: AC | PRN

## 2013-09-23 NOTE — ED Notes (Signed)
Patient is alert and oriented x3.  She states that she had a slip / fall walking  Down steps.  During her fall she hit her head but denies any LOC.  She also  Has a small laceration on the left arm with bleeding controlled.  Notable swelling to  The left lower leg.  Currently she rates her pain 8 of 10.

## 2013-09-23 NOTE — Discharge Instructions (Signed)
Tibial and Fibular Fracture, Adult  ° ° °Tibial and fibular fracture is a break in the bones of your lower leg (tibia and fibula). The tibia is the larger of these two bones. The fibula is the smaller of the two bones. It is on the outer side of your leg.  °CAUSES  °Low-energy injuries, such as a fall from ground level.  °High-energy injuries, such as motor vehicle injuries, gunshot wounds, or high-speed sports collisions. °RISK FACTORS  °Jumping activities.  °Repetitive stress, such as long-distance running.  °Participation in sports.  °Osteoporosis.  °Advanced age. °SIGNS AND SYMPTOMS  °Pain.  °Swelling.  °Inability to put weight on your injured leg.  °Bone deformities at the site of your injury.  °Bruising. °DIAGNOSIS  °Tibial and fibular fractures are diagnosed with the use of X-ray exams.  °TREATMENT  °If you have a simple fracture of these two bones, they can be treated with simple immobilization. A cast or splint will be used on your leg to keep it from moving while it heals. Then you can begin range-of-motion exercises to regain your knee motion.  °HOME CARE INSTRUCTIONS  °Apply ice to your leg:  °Put ice in a plastic bag.  °Place a towel between your skin and the bag.  °Leave the ice on for 20 minutes, 2 3 times a day.  °If you have a plaster or fiberglass cast:  °Do not try to scratch the skin under the cast using sharp or pointed objects.  °Check the skin around the cast every day. You may put lotion on any red or sore areas.  °Keep your cast dry and clean.  °If you have a plaster splint:  °Wear the splint as directed.  °You may loosen the elastic around the splint if your toes become numb, tingle, or turn cold or blue.  °Do not put pressure on any part of your cast or splint until it is fully hardened, because it may deform.  °Your cast or splint can be protected during bathing with a plastic bag. Do not lower the cast or splint into water.  °Use crutches as directed.  °Only take over-the-counter or  prescription medicines for pain, discomfort, or fever as directed by your health care provider.  °Follow all instructions given to you by your health care provider.  °Make and keep all follow-up appointments. °SEEK MEDICAL CARE IF:  °Your pain is becoming worse rather than better or is not controlled with medicines.  °You have increased swelling or redness in the foot.  °You begin to lose feeling in your foot or toes. °SEEK IMMEDIATE MEDICAL CARE IF:  °You develop a cold or blue foot or toes on the injured side.  °You develop severe pain in your injured leg, especially if the pain is increased with movement of your toes. °MAKE SURE YOU:  °Understand these instructions.  °Will watch your condition.  °Will get help right away if you are not doing well or get worse. °Document Released: 02/21/2002 Document Revised: 03/22/2013 Document Reviewed: 01/11/2013  °[employer]® Patient Information ©2014 [employer]. ° °

## 2013-09-23 NOTE — ED Provider Notes (Signed)
CSN: [id number]     Arrival date & time 09/23/13  1927 History   First MD Initiated Contact with Patient 09/23/13 2024     Chief Complaint  Patient presents with  . Fall    HPI Pt was walking and slipped on a wet floor.  She fell hitting her head but did not lose consciousness.  She twisted her leg and is having pain in her left leg.  Unable to put any weight on her leg. She believes she broke her leg.  The ankle and foot are not causing trouble.  The pain is primarily in her shin where she can feel a swolen area.  Past Medical History  Diagnosis Date  . Depression   . Sleep apnea   . Hyperlipidemia   . Scoliosis   . Osteoporosis   . Shingles    Past Surgical History  Procedure Laterality Date  . Breast cysts resected    . Left foot surgery    . Bunionectomy    . Hammer toe surgery    . Abdominal hernia repair     Family History  Problem Relation Age of Onset  . Cancer - Lung Father   . Hypertension Father   . Dementia Mother    History  Substance Use Topics  . Smoking status: Former Games developer  . Smokeless tobacco: Not on file     Comment: Mild smoker.  quit 45 years ago.    . Alcohol Use: No   OB History   Grav Para Term Preterm Abortions TAB SAB Ect Mult Living                 Review of Systems  All other systems reviewed and are negative.     Allergies  Review of patient's allergies indicates no known allergies.  Home Medications   Current Outpatient Rx  Name  Route  Sig  Dispense  Refill  . aspirin 81 MG tablet   Oral   Take 81 mg by mouth daily.         . Biotin 5000 MCG TABS   Oral   Take 1 tablet by mouth daily.         . CELEBREX 200 MG capsule   Oral   Take 200 mg by mouth every other day.          . Cholecalciferol (VITAMIN D-3) 5000 UNITS TABS   Oral   Take 1 tablet by mouth daily.         . Coenzyme Q10 (CO Q-10) 300 MG CAPS   Oral   Take 1 capsule by mouth daily.         Marland Kitchen escitalopram (LEXAPRO) 10 MG tablet   Oral  Take 1 tablet by mouth daily.         Marland Kitchen ibuprofen (ADVIL,MOTRIN) 200 MG tablet   Oral   Take 200 mg by mouth every 6 (six) hours as needed for headache.         . pravastatin (PRAVACHOL) 80 MG tablet   Oral   Take 1 tablet by mouth daily.          BP 110/69  Pulse 66  Temp(Src) 97.7 F (36.5 C) (Oral)  Resp 16  SpO2 95% Physical Exam  Nursing note and vitals reviewed. Constitutional: She appears well-developed and well-nourished. No distress.  HENT:  Head: Normocephalic and atraumatic.  Right Ear: External ear normal.  Left Ear: External ear normal.  Mouth/Throat: No oropharyngeal exudate.  Eyes: Conjunctivae  are normal. Right eye exhibits no discharge. Left eye exhibits no discharge. No scleral icterus.  Neck: Neck supple. No tracheal deviation present.  Cardiovascular: Normal rate.   Pulmonary/Chest: Effort normal. No stridor. No respiratory distress.  Musculoskeletal: She exhibits no edema.       Left hip: Normal.       Left knee: Normal.       Left ankle: Normal.       Cervical back: Normal.       Thoracic back: Normal.       Lumbar back: Normal.       Left lower leg: She exhibits tenderness, bony tenderness and swelling.       Legs:      Left foot: Normal.  Neurological: She is alert. Cranial nerve deficit: no gross deficits.  Skin: Skin is warm and dry. No rash noted.  Psychiatric: She has a normal mood and affect.    ED Course  Procedures (including critical care time)  Imaging Review Dg Tibia/fibula Left  09/23/2013   CLINICAL DATA:  Fall.  Pain along the distal aspect of tib-fib.  EXAM: LEFT TIBIA AND FIBULA - 2 VIEW  COMPARISON:  None.  FINDINGS: There is an oblique fracture of the proximal left fibular shaft with minimal angulation. There is a transverse fracture of the distal left tibial metaphysis. 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 Mild focal cortical buckling is demonstrated along the distal fibular shaft posteriorly. This may  indicate a nondisplaced fracture. Ankle mortise and talar dome appear intact. Degenerative changes in the intertarsal joints.  IMPRESSION: Fractures of the proximal left fibula and distal left tibia. Possible buckle fracture of the distal fibula.   Electronically Signed   By: Burman NievesWilliam  Stevens M.D.   On: 09/23/2013 21:13    Medications  HYDROcodone-acetaminophen (NORCO/VICODIN) 5-325 MG per tablet 1 tablet (1 tablet Oral Given 09/23/13 2041)     MDM   Final diagnoses:  Fracture, tibia and fibula    As the patient suspected she has a fracture of the distal tibia.  Compartments are soft.  Pt is not in any distress.  Discussed with Dr Rennis ChrisSupple.  Will splint in ED, ice crutches, elevated.  Follow up in the office on Monday.   Celene KrasJon R Oluwatosin Higginson, MD 09/23/13 2152

## 2014-09-08 IMAGING — CR DG TIBIA/FIBULA 2V*L*
4 series · 4 of 4 positions shown · non-contrast
Comparison: None.

CLINICAL DATA: Fall.  Pain along the distal aspect of tib-fib.

EXAM:
LEFT TIBIA AND FIBULA - 2 VIEW

[x tib-fib lat left (1 of 3)]
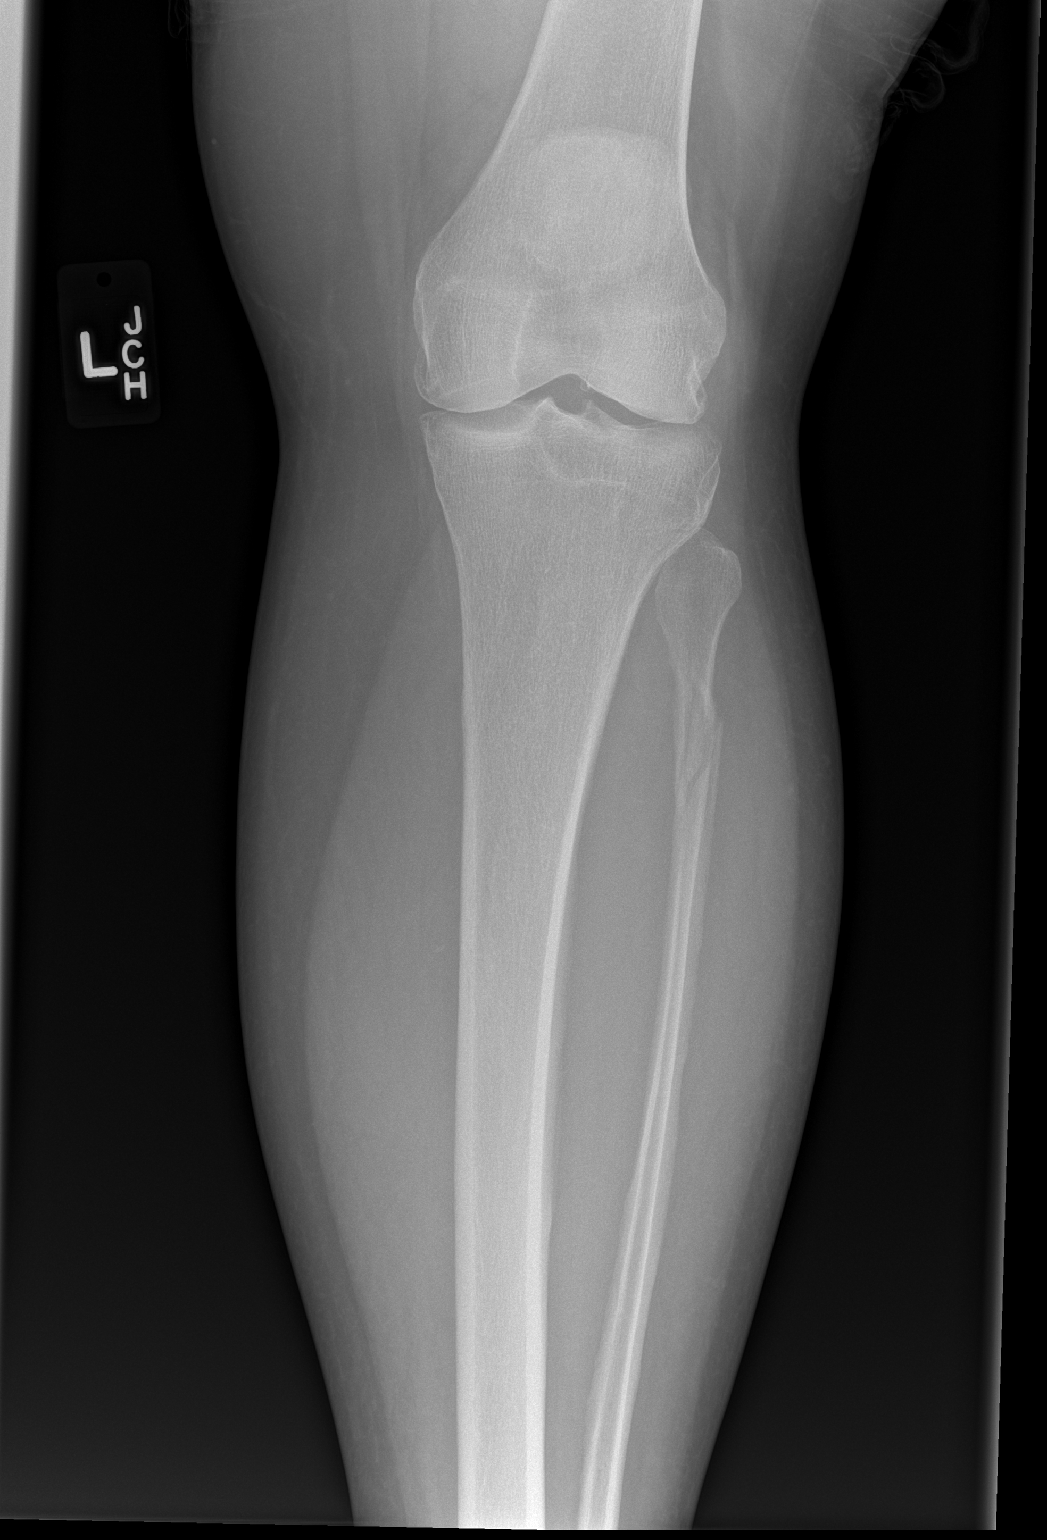

[x tib-fib ap left]
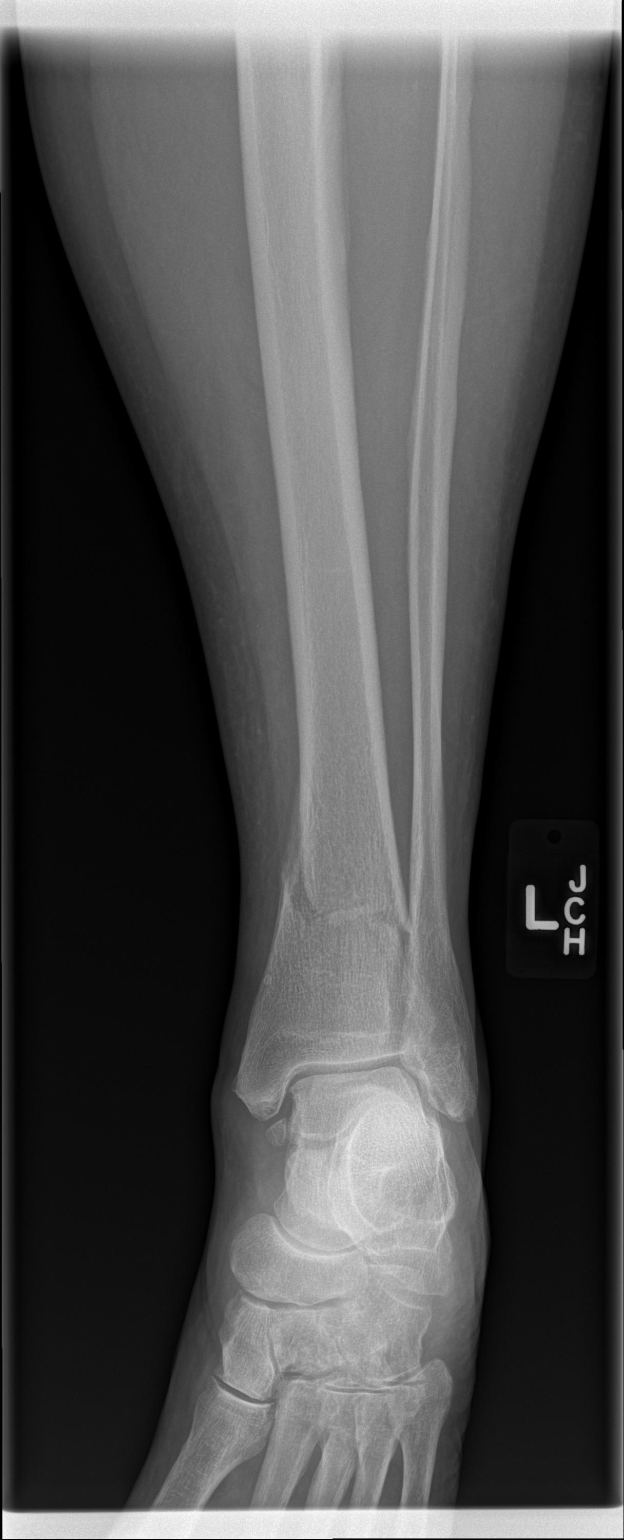

[x tib-fib lat left (2 of 3)]
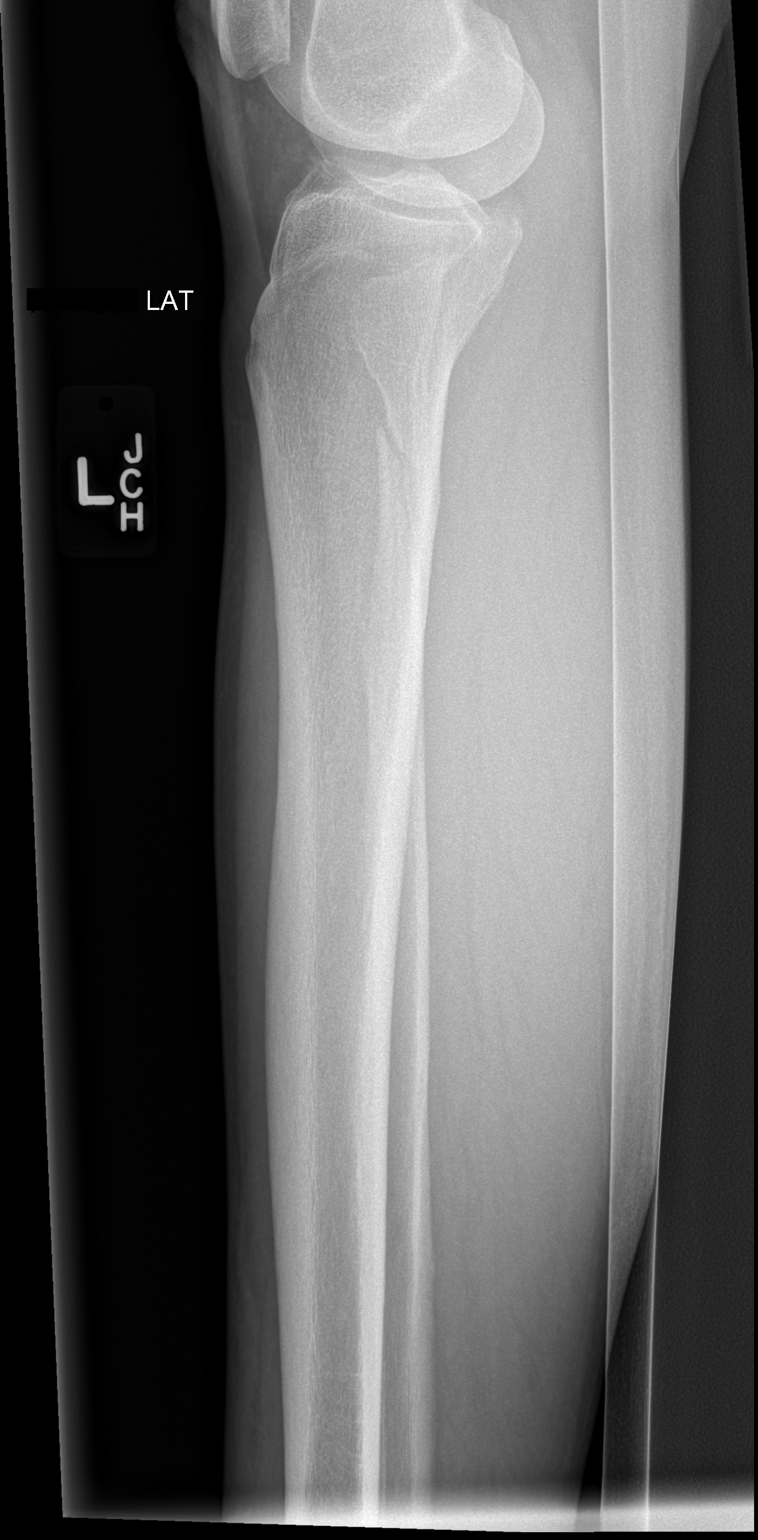

[x tib-fib lat left (3 of 3)]
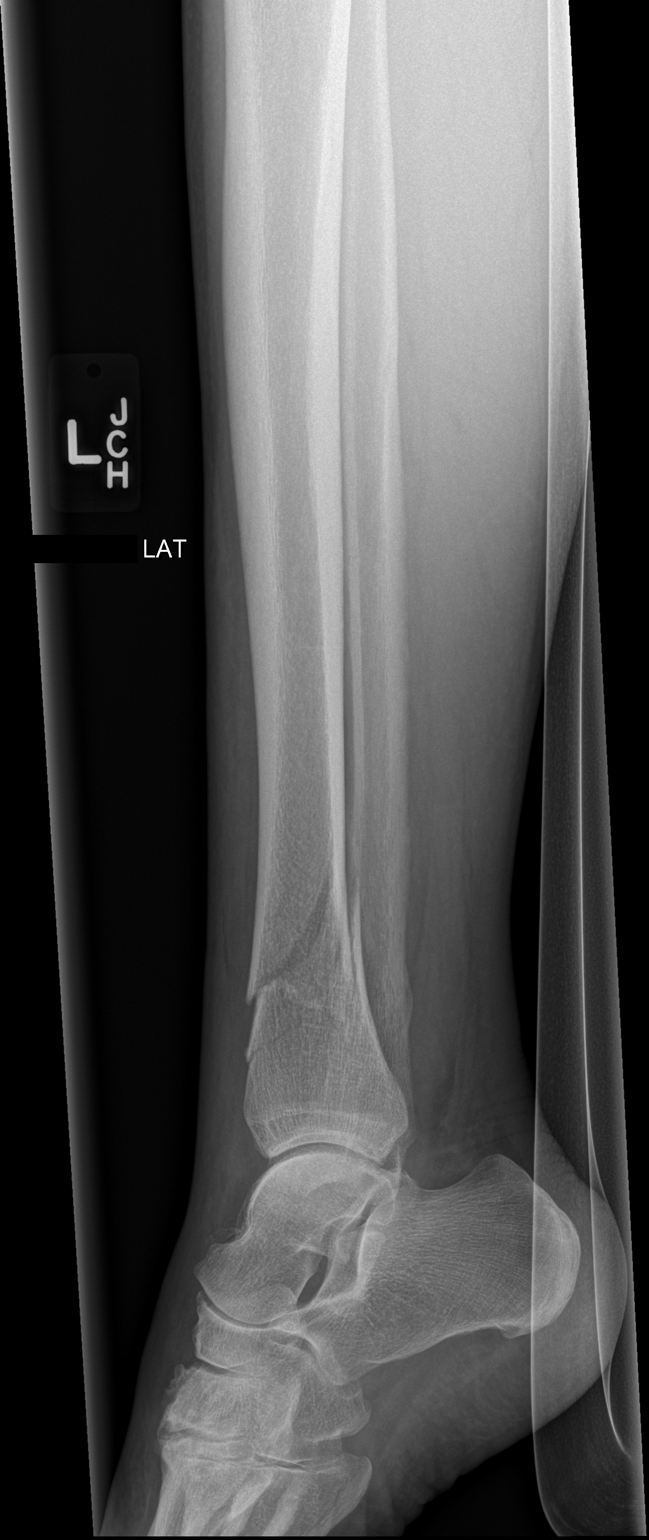

[4 of 4 positions shown; findings below may reference images not displayed]

FINDINGS: There is an oblique fracture of the proximal left fibular shaft with
minimal angulation. There is a transverse fracture of the distal
left tibial metaphysis. Old ununited ossicle inferior to the medial
malleolus. Mild focal cortical buckling is demonstrated along the
distal fibular shaft posteriorly. This may indicate a nondisplaced
fracture. Ankle mortise and talar dome appear intact. Degenerative
changes in the intertarsal joints.
IMPRESSION: Fractures of the proximal left fibula and distal left tibia.
Possible buckle fracture of the distal fibula.
